# Patient Record
Sex: Male | Born: 1990
Health system: Southern US, Community
[De-identification: ages and names within clinical notes are randomized; demographics above are authoritative.]

## PROBLEM LIST (undated history)

## (undated) DIAGNOSIS — F32A Depression, unspecified: Secondary | ICD-10-CM

## (undated) HISTORY — DX: Depression, unspecified: F32.A

---

## 2020-08-11 DIAGNOSIS — Z20828 Contact with and (suspected) exposure to other viral communicable diseases: Secondary | ICD-10-CM | POA: Diagnosis not present

## 2020-10-18 DIAGNOSIS — G118 Other hereditary ataxias: Secondary | ICD-10-CM

## 2020-10-18 DIAGNOSIS — N200 Calculus of kidney: Secondary | ICD-10-CM

## 2020-10-18 HISTORY — DX: Other hereditary ataxias: G11.8

## 2020-10-18 HISTORY — DX: Calculus of kidney: N20.0

## 2020-10-20 ENCOUNTER — Ambulatory Visit: Payer: Self-pay | Admitting: Family Medicine

## 2020-10-21 ENCOUNTER — Other Ambulatory Visit: Payer: Self-pay

## 2020-10-22 ENCOUNTER — Ambulatory Visit (INDEPENDENT_AMBULATORY_CARE_PROVIDER_SITE_OTHER): Payer: BC Managed Care – PPO | Admitting: Family Medicine

## 2020-10-22 ENCOUNTER — Encounter: Payer: Self-pay | Admitting: Neurology

## 2020-10-22 ENCOUNTER — Encounter: Payer: Self-pay | Admitting: Family Medicine

## 2020-10-22 VITALS — BP 100/67 | HR 82 | Temp 97.9°F | Resp 16 | Ht 69.75 in | Wt 192.2 lb

## 2020-10-22 DIAGNOSIS — R42 Dizziness and giddiness: Secondary | ICD-10-CM | POA: Diagnosis not present

## 2020-10-22 DIAGNOSIS — R29818 Other symptoms and signs involving the nervous system: Secondary | ICD-10-CM | POA: Diagnosis not present

## 2020-10-22 DIAGNOSIS — Z0000 Encounter for general adult medical examination without abnormal findings: Secondary | ICD-10-CM

## 2020-10-22 DIAGNOSIS — Z23 Encounter for immunization: Secondary | ICD-10-CM

## 2020-10-22 LAB — CBC WITH DIFFERENTIAL/PLATELET
Basophils Absolute: 0 10*3/uL (ref 0.0–0.1)
Basophils Relative: 0.7 % (ref 0.0–3.0)
Eosinophils Absolute: 0.1 10*3/uL (ref 0.0–0.7)
Eosinophils Relative: 1.8 % (ref 0.0–5.0)
HCT: 40.8 % (ref 39.0–52.0)
Hemoglobin: 14 g/dL (ref 13.0–17.0)
Lymphocytes Relative: 29.4 % (ref 12.0–46.0)
Lymphs Abs: 1.4 10*3/uL (ref 0.7–4.0)
MCHC: 34.3 g/dL (ref 30.0–36.0)
MCV: 83.4 fl (ref 78.0–100.0)
Monocytes Absolute: 0.4 10*3/uL (ref 0.1–1.0)
Monocytes Relative: 8.7 % (ref 3.0–12.0)
Neutro Abs: 2.9 10*3/uL (ref 1.4–7.7)
Neutrophils Relative %: 59.4 % (ref 43.0–77.0)
Platelets: 205 10*3/uL (ref 150.0–400.0)
RBC: 4.89 Mil/uL (ref 4.22–5.81)
RDW: 13 % (ref 11.5–15.5)
WBC: 4.9 10*3/uL (ref 4.0–10.5)

## 2020-10-22 LAB — COMPREHENSIVE METABOLIC PANEL
ALT: 14 U/L (ref 0–53)
AST: 15 U/L (ref 0–37)
Albumin: 4.7 g/dL (ref 3.5–5.2)
Alkaline Phosphatase: 59 U/L (ref 39–117)
BUN: 15 mg/dL (ref 6–23)
CO2: 29 mEq/L (ref 19–32)
Calcium: 9.1 mg/dL (ref 8.4–10.5)
Chloride: 105 mEq/L (ref 96–112)
Creatinine, Ser: 0.91 mg/dL (ref 0.40–1.50)
GFR: 113.49 mL/min (ref >60.00)
Glucose, Bld: 90 mg/dL (ref 70–99)
Potassium: 4 mEq/L (ref 3.5–5.1)
Sodium: 139 mEq/L (ref 135–145)
Total Bilirubin: 0.9 mg/dL (ref 0.2–1.2)
Total Protein: 6.8 g/dL (ref 6.0–8.3)

## 2020-10-22 LAB — TSH: TSH: 0.84 u[IU]/mL (ref 0.35–4.50)

## 2020-10-22 NOTE — Progress Notes (Signed)
Office Note 10/22/2020  CC:  Chief Complaint  Patient presents with  . Establish Care    Previous PCP 3-4 years ago, now retired.    HPI:  Jimmy Stephenson is a 30 y.o. male who is here to establish care Patient's most recent primary MD: see above. Old records were not available for review prior to or during today's visit. Has speech impediment so this affects hx some.  Has "hard to describe" dizziness type complaint. Onset about 3 yrs ago, seems to be happening more frequently lately, 3-4 times per week, sometimes a few minutes but sometimes up to 1-2 hours.  Feels off balance, feels some arms and legs uncoordinated feeling, some difficulty getting words out normally, feeling "pressure in head" like "fuzzy thinking".  Sensation of muffled hearing sometimes.  Has experienced a couple episodes of blurry vision and a couple times double vision. Sometimes only one or two of the above things occurs, sometimes all. Happens at work for the most part but occ outside of work as well.  Works full time as Location manager, not very stressful or strenuous per his report.  No palpitations.  No HAs.  No paresthesias.  Says no assoc with poor fluid intake.   Gets 5-6 hrs sleep nightly, long term--no changes assoc with his sx's.   Half sister has MS (age 14). O/w no known famhx.  Past Medical History:  Diagnosis Date  . Depression    after a breakup with a GF    History reviewed. No pertinent surgical history.  History reviewed. No pertinent family history.  Social History   Socioeconomic History  . Marital status: Married    Spouse name: Not on file  . Number of children: Not on file  . Years of education: Not on file  . Highest education level: Not on file  Occupational History  . Not on file  Tobacco Use  . Smoking status: Former Smoker    Years: 3.00    Types: Cigarettes  . Smokeless tobacco: Never Used  Vaping Use  . Vaping Use: Never used  Substance and Sexual Activity  .  Alcohol use: Yes    Comment: social  . Drug use: Never  . Sexual activity: Not on file  Other Topics Concern  . Not on file  Social History Narrative   Married, no children.   Orig from [place] area.   Educ: [employer].  Some college ([employer]).   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   Alc: once in a while.   Tob: no   Drugs: none   Social Determinants of Corporate investment banker Strain: Not on file  Food Insecurity: Not on file  Transportation Needs: Not on file  Physical Activity: Not on file  Stress: Not on file  Social Connections: Not on file  Intimate Partner Violence: Not on file    No outpatient encounter medications on file as of 10/22/2020.   No facility-administered encounter medications on file as of 10/22/2020.    No Known Allergies  ROS Review of Systems  Constitutional: Negative for appetite change, chills, fatigue and fever.  HENT: Negative for congestion, dental problem, ear pain and sore throat.   Eyes: Positive for visual disturbance. Negative for discharge and redness.  Respiratory: Negative for cough, chest tightness, shortness of breath and wheezing.   Cardiovascular: Negative for chest pain, palpitations and leg swelling.  Gastrointestinal: Negative for abdominal pain, blood in stool, diarrhea, nausea and vomiting.  Endocrine: Negative for cold  intolerance, heat intolerance, polydipsia, polyphagia and polyuria.  Genitourinary: Negative for difficulty urinating, dysuria, flank pain, frequency, hematuria and urgency.  Musculoskeletal: Negative for arthralgias, back pain, joint swelling, myalgias and neck stiffness.  Skin: Negative for pallor and rash.  Neurological: Positive for dizziness and light-headedness. Negative for tremors, seizures, syncope, speech difficulty, weakness, numbness and headaches.  Hematological: Negative for adenopathy. Does not bruise/bleed easily.  Psychiatric/Behavioral: Negative for confusion and sleep  disturbance. The patient is not nervous/anxious.    PE; Blood pressure 100/67, pulse 82, temperature 97.9 F (36.6 C), temperature source Oral, resp. rate 16, height 5' 9.75" (1.772 m), weight 192 lb 3.2 oz (87.2 kg), SpO2 98 %. Body mass index is 27.78 kg/m.  Gen: Alert, well appearing.  Patient is oriented to person, place, time, and situation. AFFECT: pleasant, lucid thought and speech. ENT: Ears: EACs clear, normal epithelium.  TMs with good light reflex and landmarks bilaterally.  Eyes: no injection, icteris, swelling, or exudate.  EOMI, PERRLA. Nose: no drainage or turbinate edema/swelling.  No injection or focal lesion.  Mouth: lips without lesion/swelling.  Oral mucosa pink and moist.  Dentition intact and without obvious caries or gingival swelling.  Oropharynx without erythema, exudate, or swelling.  Neck: supple/nontender.  No LAD, mass, or TM.  Carotid pulses 2+ bilaterally, without bruits. CV: RRR, no m/r/g.   LUNGS: CTA bilat, nonlabored resps, good aeration in all lung fields. ABD: soft, NT, ND, BS normal.  No hepatospenomegaly or mass.  No bruits. EXT: no clubbing, cyanosis, or edema.  Musculoskeletal: no joint swelling, erythema, warmth, or tenderness.  ROM of all joints intact. Skin - no sores or suspicious lesions or rashes or color changes Neuro: CN 2-12 intact bilaterally, strength 5/5 in proximal and distal upper extremities and lower extremities bilaterally.  No sensory deficits.  No tremor.  No disdiadochokinesis.  No ataxia.  Upper extremity and lower extremity DTRs symmetric.  No pronator drift.  Pertinent labs:  none  ASSESSMENT AND PLAN:   New pt:  Transient neurologic symptoms. Unfortunately a bit nondescript/vague in nature. Unknown etiology but no alarming/red flag symptoms to suggest ominous neurologic disorder at this time. Will proceed with general labs (cbc, cmet, tsh) and have him see neurologist (? Atypical seizure d/o?). No meds or imaging at this  time.  Flu vaccine today. Covid UTD.  An After Visit Summary was printed and given to the patient.  Return in about 1 year (around 10/22/2021) for annual CPE (fasting).  Signed:  Santiago Bumpers, MD           10/22/2020

## 2020-12-17 NOTE — Progress Notes (Signed)
   NEUROLOGY CONSULTATION NOTE  Jimmy Stephenson MRN: [id number] DOB: August 25, 1991  Referring provider: Nicoletta Ba, MD Primary care provider: Nicoletta Ba, MD  Reason for consult:  Dizziness  Assessment/Plan:   1.  Transient recurrent episodes of dizziness/ataxia.    1.  MRI of brain with and without contrast 2.  Routine EEG 3.  If above testing unremarkable, I would check an ambulatory EEG.    Subjective:  Jimmy Stephenson is a 30 year old right-handed male who presents for dizziness.  History supplemented by referring provider's note.  He has had recurrent episodes of dizziness since he was young.  They used to occur once in awhile but have become more frequent since around 2019.  He describes onset of feeling lightheaded and  unable to control arms and legs, causing him to be unsteady on his feet.  He has trouble getting words out.  They may last minutes but can last up to 2 hours.  No headache, visual changes, diaphoresis, palpitations, numbness or weakness.  It usually occurs when he is at work.  It has never caused him to actually pass out or fall.  Denies history of headaches, seizures, or head trauma. Regarding family history:  Brother had seizures as a toddler.  He has an uncle with seizure disorder.  His half-sister has multiple sclerosis.  He was told by his mother that there is a family history of some kind of brainstem disorder.  CBC, CMP and TSH from January were normal  PAST MEDICAL HISTORY: Past Medical History:  Diagnosis Date  . Depression    after a breakup with a GF    PAST SURGICAL HISTORY: No past surgical history on file.  MEDICATIONS: No current outpatient medications on file prior to visit.   No current facility-administered medications on file prior to visit.    ALLERGIES: No Known Allergies  FAMILY HISTORY: No family history on file.   Objective:  Blood pressure 129/83, pulse 83, height 5\' 10"  (1.778 m), weight 199 lb 3.2 oz (90.4 kg), SpO2  98 %. General: No acute distress.  Patient appears well-groomed.   Head:  Normocephalic/atraumatic Eyes:  fundi examined but not visualized Neck: supple, no paraspinal tenderness, full range of motion Back: No paraspinal tenderness Heart: regular rate and rhythm Lungs: Clear to auscultation bilaterally. Vascular: No carotid bruits. Neurological Exam: Mental status: alert and oriented to person, place, and time, recent and remote memory intact, fund of knowledge intact, attention and concentration intact, speech fluent and not dysarthric, language intact. Cranial nerves: CN I: not tested CN II: pupils equal, round and reactive to light, visual fields intact CN III, IV, VI:  full range of motion, no nystagmus, no ptosis CN V: facial sensation intact. CN VII: upper and lower face symmetric CN VIII: hearing intact CN IX, X: gag intact, uvula midline CN XI: sternocleidomastoid and trapezius muscles intact CN XII: tongue midline Bulk & Tone: normal, no fasciculations. Motor:  muscle strength 5/5 throughout Sensation:  Pinprick, temperature and vibratory sensation intact. Deep Tendon Reflexes:  2+ throughout,  toes downgoing.   Finger to nose testing:  Without dysmetria.   Heel to shin:  Without dysmetria.   Gait:  Normal station and stride.  Romberg negative.    Thank you for allowing me to take part in the care of this patient.  , DO  CC:  Shon Millet, MD

## 2020-12-18 ENCOUNTER — Ambulatory Visit: Payer: BC Managed Care – PPO | Admitting: Neurology

## 2020-12-18 ENCOUNTER — Encounter: Payer: Self-pay | Admitting: Neurology

## 2020-12-18 ENCOUNTER — Other Ambulatory Visit: Payer: Self-pay

## 2020-12-18 VITALS — BP 129/83 | HR 83 | Ht 70.0 in | Wt 199.2 lb

## 2020-12-18 DIAGNOSIS — G118 Other hereditary ataxias: Secondary | ICD-10-CM

## 2020-12-18 DIAGNOSIS — R42 Dizziness and giddiness: Secondary | ICD-10-CM | POA: Diagnosis not present

## 2020-12-18 NOTE — Patient Instructions (Signed)
1.  We will check MRI of brain with and without contrast 2  We will check routine EEG 3.  Further recommendations pending results.

## 2020-12-22 ENCOUNTER — Other Ambulatory Visit: Payer: Self-pay

## 2020-12-22 ENCOUNTER — Ambulatory Visit (INDEPENDENT_AMBULATORY_CARE_PROVIDER_SITE_OTHER): Payer: BC Managed Care – PPO | Admitting: Neurology

## 2020-12-22 DIAGNOSIS — G118 Other hereditary ataxias: Secondary | ICD-10-CM

## 2020-12-22 DIAGNOSIS — R42 Dizziness and giddiness: Secondary | ICD-10-CM | POA: Diagnosis not present

## 2020-12-23 NOTE — Procedures (Signed)
ELECTROENCEPHALOGRAM REPORT  Date of Study: 12/22/2020  Patient's Name: Jimmy Stephenson MRN: [id number] Date of Birth: Apr 02, 1991   Clinical History: 30 year old male with episodic dizziness and trouble getting words out.  Medications: None  Technical Summary: A multichannel digital EEG recording measured by the international 10-20 system with electrodes applied with paste and impedances below 5000 ohms performed in our laboratory with EKG monitoring in an awake and asleep patient.  Hyperventilation not performed as patient wearing face mask due to COVID-19 pandemic.  Photic stimulation was performed.  The digital EEG was referentially recorded, reformatted, and digitally filtered in a variety of bipolar and referential montages for optimal display.    Description: The patient is awake and asleep during the recording.  During maximal wakefulness, there is a symmetric, medium voltage 9 Hz posterior dominant rhythm that attenuates with eye opening.  During drowsiness and sleep, there is an increase in theta slowing of the background with shifting asymmetric sharply contoured theta in the bilateral temporal region.  Vertex waves and symmetric sleep spindles were seen.  Photic stimulation did not elicit any abnormalities.  There were no epileptiform discharges or electrographic seizures seen.    EKG lead was unremarkable.  Impression: This awake and asleep EEG is within normal limits.    Clinical Correlation: A normal EEG does not exclude a clinical diagnosis of epilepsy.  If further clinical questions remain, prolonged EEG may be helpful.  Clinical correlation is advised.   Shon Millet, DO

## 2021-01-05 ENCOUNTER — Other Ambulatory Visit: Payer: BC Managed Care – PPO

## 2021-01-05 DIAGNOSIS — J189 Pneumonia, unspecified organism: Secondary | ICD-10-CM | POA: Diagnosis not present

## 2021-01-05 DIAGNOSIS — Z20828 Contact with and (suspected) exposure to other viral communicable diseases: Secondary | ICD-10-CM | POA: Diagnosis not present

## 2021-01-05 DIAGNOSIS — R509 Fever, unspecified: Secondary | ICD-10-CM | POA: Diagnosis not present

## 2021-01-05 DIAGNOSIS — R059 Cough, unspecified: Secondary | ICD-10-CM | POA: Diagnosis not present

## 2021-01-10 ENCOUNTER — Other Ambulatory Visit: Payer: BC Managed Care – PPO

## 2021-01-19 ENCOUNTER — Ambulatory Visit
Admission: RE | Admit: 2021-01-19 | Discharge: 2021-01-19 | Disposition: A | Discharge status: Home / Self Care | Payer: BC Managed Care – PPO | Source: Ambulatory Visit | Attending: Neurology | Admitting: Neurology

## 2021-01-19 DIAGNOSIS — R42 Dizziness and giddiness: Secondary | ICD-10-CM

## 2021-01-19 DIAGNOSIS — G118 Other hereditary ataxias: Secondary | ICD-10-CM

## 2021-01-19 MED ORDER — GADOBENATE DIMEGLUMINE 529 MG/ML IV SOLN
18.0000 mL | Freq: Once | INTRAVENOUS | Status: AC | PRN
  Administered 2021-01-19: 18 mL via INTRAVENOUS

## 2021-01-21 NOTE — Progress Notes (Signed)
   NEUROLOGY FOLLOW UP OFFICE NOTE  Draydon Clairmont [id number]  Assessment/Plan:   1.  Recurrent transient episodes of dizziness and ataxia.  Endorses family history.  MRI does not reveal cerebellar atrophy to suggest spinocerebellar ataxia.  Query episodic ataxia.  Definitive testing would be genetic testing which can be expensive.  Will first try to treat with trial of acetazolamide 500mg  twice daily.  Follow up 4 to 6 months.  Subjective:  Jimmy Stephenson is a 30 year old right-handed male who follows up for recurrent episodes of dizziness.  UPDATE: EEG on 12/22/2020 was normal.  MRI of brain on 01/19/2021 personally reviewed was within normal limits (noted nonspecific single punctate T2/FLAIR hyperintensity in right parietal white matter of likely no clinical significance).  HISTORY: He has had recurrent episodes of dizziness since he was young.  They used to occur once in awhile but have become more frequent since around 2019.  He describes onset of feeling lightheaded and  unable to control arms and legs, causing him to be unsteady on his feet.  He has trouble getting words out.  They may last minutes but can last up to 2 hours.  No headache, visual changes, diaphoresis, palpitations, numbness or weakness.  It usually occurs when he is at work.  It has never caused him to actually pass out or fall.  Denies history of headaches, seizures, or head trauma. Regarding family history:  Brother had seizures as a toddler.  He has an uncle with seizure disorder.  His half-sister has multiple sclerosis.  He was told by his mother that there is a family history of "some kind of brainstem disorder".  CBC, CMP and TSH from January 2022 were normal  PAST MEDICAL HISTORY: Past Medical History:  Diagnosis Date  . Depression    after a breakup with a GF    MEDICATIONS: No current outpatient medications on file prior to visit.   No current facility-administered medications on file prior to visit.     ALLERGIES: No Known Allergies  FAMILY HISTORY: Family History  Problem Relation Age of Onset  . Seizures Brother       Objective:  Blood pressure 119/79, pulse 81, height 5\' 10"  (1.778 m), weight 193 lb (87.5 kg), SpO2 97 %. General: No acute distress.  Patient appears well-groomed.     February 2022, DO

## 2021-01-23 ENCOUNTER — Ambulatory Visit: Payer: BC Managed Care – PPO | Admitting: Neurology

## 2021-01-23 ENCOUNTER — Encounter: Payer: Self-pay | Admitting: Neurology

## 2021-01-23 ENCOUNTER — Other Ambulatory Visit: Payer: Self-pay

## 2021-01-23 VITALS — BP 119/79 | HR 81 | Ht 70.0 in | Wt 193.0 lb

## 2021-01-23 DIAGNOSIS — G118 Other hereditary ataxias: Secondary | ICD-10-CM | POA: Diagnosis not present

## 2021-01-23 MED ORDER — ACETAZOLAMIDE ER 500 MG PO CP12: 500.0000 mg | ORAL_CAPSULE | Freq: Two times a day (BID) | ORAL | 5 refills | Status: DC

## 2021-01-23 NOTE — Patient Instructions (Signed)
I am not sure but I would like to treat you for a genetic condition called EPISODIC ATAXIA.  Genetic testing would be expensive but at least we can try treating it with the recommended medication.  The medication used to treat this is called acetazolamide.  Side effects may include numbness and tingling sensation, so don't worry if you experience this, it is just a side effect.  Keep track and see if you are experiencing less episodes of this dizziness.    Acetazolamide 500mg  twice daily Follow up 4 to 6 months.

## 2021-01-29 ENCOUNTER — Telehealth: Payer: Self-pay | Admitting: Neurology

## 2021-01-29 NOTE — Telephone Encounter (Signed)
Patient called requesting a call back from a nurse. He'd like to know if the medication he is taking, diamox 500 mg, could cause stomach acid problems?

## 2021-01-29 NOTE — Telephone Encounter (Signed)
Then numbness and tingling is not uncommon with the acetazolamide.  I am not sure about the reflux.  Can he just take something over the counter for reflux?  I otherwise have no other recommendation as far as medication

## 2021-01-29 NOTE — Telephone Encounter (Signed)
Pt advised. Pt states he having altered taste.

## 2021-01-29 NOTE — Telephone Encounter (Signed)
Telephone call to Jimmy Stephenson, Jimmy Stephenson wanted to know if the medication Diamax causes acid reflux. Per Jimmy Stephenson the acid reflux started a couple of days ago.   Jimmy Stephenson also c/o tingling sensation around his lips, bottoms of his feet and his finger tips.    Please advise.

## 2021-03-17 DIAGNOSIS — N209 Urinary calculus, unspecified: Secondary | ICD-10-CM | POA: Diagnosis not present

## 2021-03-17 DIAGNOSIS — N200 Calculus of kidney: Secondary | ICD-10-CM | POA: Diagnosis not present

## 2021-03-17 DIAGNOSIS — R1030 Lower abdominal pain, unspecified: Secondary | ICD-10-CM | POA: Diagnosis not present

## 2021-03-17 DIAGNOSIS — Z87442 Personal history of urinary calculi: Secondary | ICD-10-CM | POA: Diagnosis not present

## 2021-03-17 DIAGNOSIS — N1330 Unspecified hydronephrosis: Secondary | ICD-10-CM | POA: Diagnosis not present

## 2021-03-17 DIAGNOSIS — K753 Granulomatous hepatitis, not elsewhere classified: Secondary | ICD-10-CM | POA: Diagnosis not present

## 2021-06-15 ENCOUNTER — Ambulatory Visit: Payer: BC Managed Care – PPO | Admitting: Neurology

## 2021-07-15 NOTE — Progress Notes (Deleted)
   NEUROLOGY FOLLOW UP OFFICE NOTE  Jimmy Stephenson [id number]  Assessment/Plan:   ***  Subjective:  Jimmy Stephenson is a 30 year old right-handed male who follows up for recurrent episodes of dizziness.   UPDATE: Due to the possibility of episodic ataxia, he was started on acetazolamide 500mg  twice daily.  However, it caused paresthesias and altered taste.  ***   HISTORY: He has had recurrent episodes of dizziness since he was young.  They used to occur once in awhile but have become more frequent since around 2019.  He describes onset of feeling lightheaded and  unable to control arms and legs, causing him to be unsteady on his feet.  He has trouble getting words out.  They may last minutes but can last up to 2 hours.  No headache, visual changes, diaphoresis, palpitations, numbness or weakness.  It usually occurs when he is at work.  It has never caused him to actually pass out or fall.  EEG on 12/22/2020 was normal.  MRI of brain on 01/19/2021 personally reviewed was within normal limits (noted nonspecific single punctate T2/FLAIR hyperintensity in right parietal white matter of likely no clinical significance).   Denies history of headaches, seizures, or head trauma. Regarding family history:  Brother had seizures as a toddler.  He has an uncle with seizure disorder.  His half-sister has multiple sclerosis.  He was told by his mother that there is a family history of "some kind of brainstem disorder".  PAST MEDICAL HISTORY: Past Medical History:  Diagnosis Date   Depression    after a breakup with a GF    MEDICATIONS: Current Outpatient Medications on File Prior to Visit  Medication Sig Dispense Refill   acetaZOLAMIDE (DIAMOX) 500 MG capsule Take 1 capsule (500 mg total) by mouth 2 (two) times daily. 60 capsule 5   No current facility-administered medications on file prior to visit.    ALLERGIES: No Known Allergies  FAMILY HISTORY: Family History  Problem Relation Age of Onset    Seizures Brother       Objective:  *** General: No acute distress.  Patient appears ***-groomed.   Head:  Normocephalic/atraumatic Eyes:  Fundi examined but not visualized Neck: supple, no paraspinal tenderness, full range of motion Heart:  Regular rate and rhythm Lungs:  Clear to auscultation bilaterally Back: No paraspinal tenderness Neurological Exam: alert and oriented to person, place, and time.  Speech fluent and not dysarthric, language intact.  CN II-XII intact. Bulk and tone normal, muscle strength 5/5 throughout.  Sensation to light touch intact.  Deep tendon reflexes 2+ throughout, toes downgoing.  Finger to nose testing intact.  Gait normal, Romberg negative.   03/21/2021, DO  CC: ***

## 2021-07-16 ENCOUNTER — Ambulatory Visit: Payer: BC Managed Care – PPO | Admitting: Neurology

## 2021-10-12 NOTE — Progress Notes (Signed)
° °  NEUROLOGY FOLLOW UP OFFICE NOTE  Jimmy Stephenson [id number]  Assessment/Plan:   Recurrent transient episodes of dizziness and ataxia.  Endorses family history.  MRI does not reveal cerebellar atrophy to suggest spinocerebellar ataxia.  Query episodic ataxia.  Definitive testing would be genetic testing  As he is doing well, continue acetazolamide 500mg  once daily Follow up one year  Subjective:  Jimmy Stephenson is a 30 year old right-handed male who follows up for recurrent episodes of dizziness.   UPDATE: Initiated trial of acetazolamide to treat for possible episodic ataxia but only taking 500mg  once daily because he forgets to take it twice a day.  However, he dizzy spells have improved.     HISTORY: He has had recurrent episodes of dizziness since he was young.  They used to occur once in awhile but have become more frequent since around 2019.  He describes onset of feeling lightheaded and  unable to control arms and legs, causing him to be unsteady on his feet.  He has trouble getting words out.  They may last minutes but can last up to 2 hours.  No headache, visual changes, diaphoresis, palpitations, numbness or weakness.  It usually occurs when he is at work.  It has never caused him to actually pass out or fall.     CBC, CMP and TSH from January 2022 were normal.  EEG on 12/22/2020 was normal.  MRI of brain on 01/19/2021 was within normal limits (noted nonspecific single punctate T2/FLAIR hyperintensity in right parietal white matter of likely no clinical significance).  Denies history of headaches, seizures, or head trauma. Regarding family history:  Brother had seizures as a toddler.  He has an uncle with seizure disorder.  His half-sister has multiple sclerosis.  He was told by his mother that there is a family history of "some kind of brainstem disorder".  PAST MEDICAL HISTORY: Past Medical History:  Diagnosis Date   Depression    after a breakup with a GF    MEDICATIONS: Current  Outpatient Medications on File Prior to Visit  Medication Sig Dispense Refill   acetaZOLAMIDE (DIAMOX) 500 MG capsule Take 1 capsule (500 mg total) by mouth 2 (two) times daily. 60 capsule 5   No current facility-administered medications on file prior to visit.    ALLERGIES: No Known Allergies  FAMILY HISTORY: Family History  Problem Relation Age of Onset   Seizures Brother       Objective:  Blood pressure 110/73, pulse (!) 101, height 5\' 10"  (1.778 m), weight 186 lb 9.6 oz (84.6 kg), SpO2 96 %. General: No acute distress.  Patient appears well-groomed.   Head:  Normocephalic/atraumatic Eyes:  Fundi examined but not visualized Neck: supple, no paraspinal tenderness, full range of motion Heart:  Regular rate and rhythm Lungs:  Clear to auscultation bilaterally Back: No paraspinal tenderness Neurological Exam: alert and oriented to person, place, and time.  Speech fluent and not dysarthric, language intact.  CN II-XII intact. Bulk and tone normal, muscle strength 5/5 throughout.  Sensation to light touch intact.  Deep tendon reflexes 2+ throughout, toes downgoing.  Finger to nose testing intact.  Gait normal, Romberg negative.   02/21/2021, DO  CC: 03/21/2021 Family [facility] at [facility]

## 2021-10-13 ENCOUNTER — Other Ambulatory Visit: Payer: Self-pay

## 2021-10-13 ENCOUNTER — Ambulatory Visit: Payer: BC Managed Care – PPO | Admitting: Neurology

## 2021-10-13 ENCOUNTER — Encounter: Payer: Self-pay | Admitting: Neurology

## 2021-10-13 VITALS — BP 110/73 | HR 101 | Ht 70.0 in | Wt 186.6 lb

## 2021-10-13 DIAGNOSIS — G118 Other hereditary ataxias: Secondary | ICD-10-CM | POA: Diagnosis not present

## 2021-10-13 NOTE — Patient Instructions (Signed)
Continue acetazolamide 500mg  daily

## 2021-10-23 ENCOUNTER — Encounter: Payer: BC Managed Care – PPO | Admitting: Family Medicine

## 2021-11-05 ENCOUNTER — Encounter: Payer: BC Managed Care – PPO | Admitting: Family Medicine

## 2021-11-09 ENCOUNTER — Other Ambulatory Visit: Payer: Self-pay

## 2021-11-09 ENCOUNTER — Encounter: Payer: Self-pay | Admitting: Family Medicine

## 2021-11-09 ENCOUNTER — Ambulatory Visit (INDEPENDENT_AMBULATORY_CARE_PROVIDER_SITE_OTHER): Payer: BC Managed Care – PPO | Admitting: Family Medicine

## 2021-11-09 VITALS — BP 112/75 | HR 74 | Temp 97.9°F | Ht 71.0 in | Wt 187.2 lb

## 2021-11-09 DIAGNOSIS — Z23 Encounter for immunization: Secondary | ICD-10-CM | POA: Diagnosis not present

## 2021-11-09 DIAGNOSIS — Z0000 Encounter for general adult medical examination without abnormal findings: Secondary | ICD-10-CM | POA: Diagnosis not present

## 2021-11-09 NOTE — Progress Notes (Signed)
Office Note 11/09/2021  CC:  Chief Complaint  Patient presents with   Annual Exam    Pt is not fasting   Patient is a 31 y.o. male who is here for annual health maintenance exam. I last saw him 1 yr ago. A/P as of last visit: "Transient neurologic symptoms. Unfortunately a bit nondescript/vague in nature. Unknown etiology but no alarming/red flag symptoms to suggest ominous neurologic disorder at this time. Will proceed with general labs (cbc, cmet, tsh) and have him see neurologist (? Atypical seizure d/o?). No meds or imaging at this time."  INTERIM HX: All labs normal 1 yr ago. He is established with neurologist, Dr. Tomi Likens, for episodic ataxia.  MRI 01/19/21 normal. He was started on acetazolamide and as per last f/u with Dr. Tomi Likens 10/13/21 this has been helpful.  F/u with neuro 1 yr was recommended.  He reports doing well since getting on acetazolamide. No complaints today. Active at work but no exercise.   Past Medical History:  Diagnosis Date   Depression    after a breakup with a GF   Nephrolithiasis 2022   Left    History reviewed. No pertinent surgical history.  Family History  Problem Relation Age of Onset   Seizures Brother     Social History   Socioeconomic History   Marital status: Married    Spouse name: Not on file   Number of children: Not on file   Years of education: Not on file   Highest education level: Not on file  Occupational History   Not on file  Tobacco Use   Smoking status: Former    Years: 3.00    Types: Cigarettes   Smokeless tobacco: Never  Vaping Use   Vaping Use: Never used  Substance and Sexual Activity   Alcohol use: Yes    Comment: social   Drug use: Never   Sexual activity: Not on file  Other Topics Concern   Not on file  Social History Narrative   Married, no children.   Orig from [place] area.   Educ: [employer].  Some college ([employer]).   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   Alc:  once in a while.   Tob: no   Drugs: none   Social Determinants of Radio broadcast assistant Strain: Not on file  Food Insecurity: Not on file  Transportation Needs: Not on file  Physical Activity: Not on file  Stress: Not on file  Social Connections: Not on file  Intimate Partner Violence: Not on file    Outpatient Medications Prior to Visit  Medication Sig Dispense Refill   acetaZOLAMIDE (DIAMOX) 500 MG capsule Take 1 capsule (500 mg total) by mouth 2 (two) times daily. 60 capsule 5   No facility-administered medications prior to visit.    No Known Allergies  ROS Review of Systems  Constitutional:  Negative for appetite change, chills, fatigue and fever.  HENT:  Negative for congestion, dental problem, ear pain and sore throat.   Eyes:  Negative for discharge, redness and visual disturbance.  Respiratory:  Negative for cough, chest tightness, shortness of breath and wheezing.   Cardiovascular:  Negative for chest pain, palpitations and leg swelling.  Gastrointestinal:  Negative for abdominal pain, blood in stool, diarrhea, nausea and vomiting.  Genitourinary:  Negative for difficulty urinating, dysuria, flank pain, frequency, hematuria and urgency.  Musculoskeletal:  Negative for arthralgias, back pain, joint swelling, myalgias and neck stiffness.  Skin:  Negative for pallor and  rash.  Neurological:  Negative for dizziness, speech difficulty, weakness and headaches.  Hematological:  Negative for adenopathy. Does not bruise/bleed easily.  Psychiatric/Behavioral:  Negative for confusion and sleep disturbance. The patient is not nervous/anxious.    PE; Vitals with BMI 11/09/2021 10/13/2021 01/23/2021  Height 5\' 11"  5\' 10"  5\' 10"   Weight 187 lbs 3 oz 186 lbs 10 oz 193 lbs  BMI 26.12 [id number]  Systolic [id number]  Diastolic 75 73 79  Pulse 74 101 81   Gen: Alert, well appearing.  Patient is oriented to person, place, time, and situation. AFFECT: pleasant, lucid thought  and speech. ENT: Ears: EACs clear, normal epithelium.  TMs with good light reflex and landmarks bilaterally.  Eyes: no injection, icteris, swelling, or exudate.  EOMI, PERRLA. Nose: no drainage or turbinate edema/swelling.  No injection or focal lesion.  Mouth: lips without lesion/swelling.  Oral mucosa pink and moist.  Dentition intact and without obvious caries or gingival swelling.  Oropharynx without erythema, exudate, or swelling.  Neck: supple/nontender.  No LAD, mass, or TM.  Carotid pulses 2+ bilaterally, without bruits. CV: RRR, no m/r/g.   LUNGS: CTA bilat, nonlabored resps, good aeration in all lung fields. ABD: soft, NT, ND, BS normal.  No hepatospenomegaly or mass.  No bruits. EXT: no clubbing, cyanosis, or edema.  Musculoskeletal: no joint swelling, erythema, warmth, or tenderness.  ROM of all joints intact. Skin - no sores or suspicious lesions or rashes or color changes  Pertinent labs:  Lab Results  Component Value Date   TSH 0.84 10/22/2020   Lab Results  Component Value Date   WBC 4.9 10/22/2020   HGB 14.0 10/22/2020   HCT 40.8 10/22/2020   MCV 83.4 10/22/2020   PLT 205.0 10/22/2020   Lab Results  Component Value Date   CREATININE 0.91 10/22/2020   BUN 15 10/22/2020   NA 139 10/22/2020   K 4.0 10/22/2020   CL 105 10/22/2020   CO2 29 10/22/2020   Lab Results  Component Value Date   ALT 14 10/22/2020   AST 15 10/22/2020   ALKPHOS 59 10/22/2020   BILITOT 0.9 10/22/2020   ASSESSMENT AND PLAN:   Health maintenance exam: Reviewed age and gender appropriate health maintenance issues (prudent diet, regular exercise, health risks of tobacco and excessive alcohol, use of seatbelts, fire alarms in home, use of sunscreen).  Also reviewed age and gender appropriate health screening as well as vaccine recommendations. Vaccines: Tdap booster given, flu vaccine given. Labs: he declined any screening lab work today.  An After Visit Summary was printed and given to the  patient.  FOLLOW UP:  Return in about 1 year (around 11/09/2022) for annual CPE (fasting).  Signed:  Crissie Sickles, MD           11/09/2021

## 2021-11-09 NOTE — Patient Instructions (Signed)

## 2022-05-17 IMAGING — MR MR HEAD WO/W CM
13 series · 48 of 48 positions shown · IV contrast (17cc multihance)
Comparison: None.

CLINICAL DATA: Dizziness.

EXAM:
MRI HEAD WITHOUT AND WITH CONTRAST
TECHNIQUE: Multiplanar, multiecho pulse sequences of the brain and surrounding
structures were obtained without and with intravenous contrast.
CONTRAST:  18mL MULTIHANCE GADOBENATE DIMEGLUMINE 529 MG/ML IV SOLN

[Series 2: T1 · sagittal · 5.0mm · 0.45mm/px · 2 of 24 slices shown]
[im 1/24]
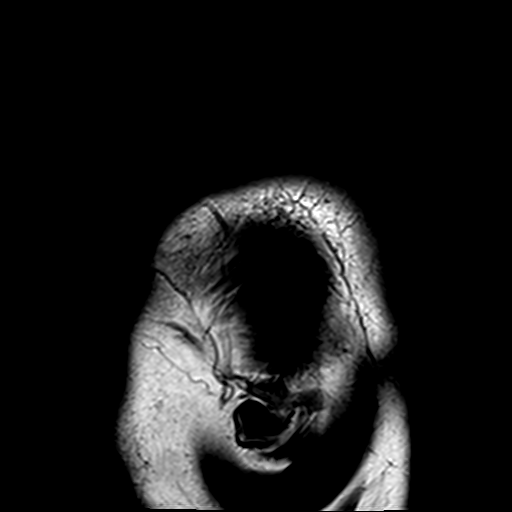
[im 24/24]
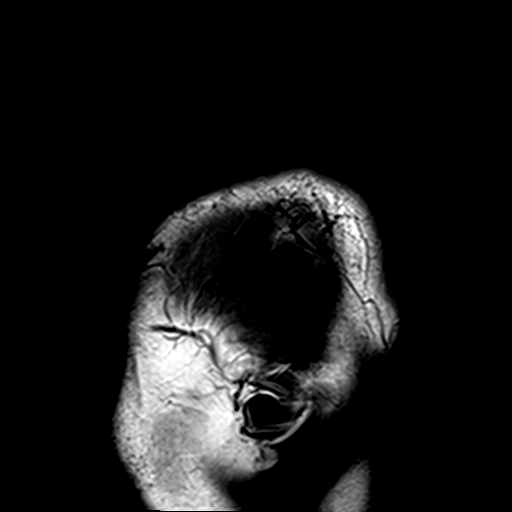

[Series 3: DWI · axial · 3.0mm · 1.80mm/px · z∈[-67,+77]mm · 6 of 100 slices shown (1 of 4)]
[im 1/100]
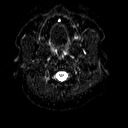
[im 20/100]
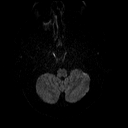
[im 40/100]
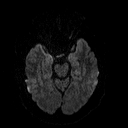
[im 60/100]
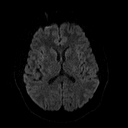
[im 80/100]
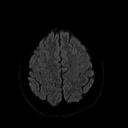
[im 100/100]
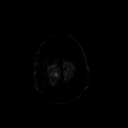

[Series 4: DWI · axial · 3.0mm · 1.80mm/px · z∈[-67,+77]mm · 3 of 49 slices shown (2 of 4)]
[im 1/49]
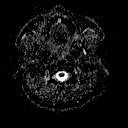
[im 25/49]
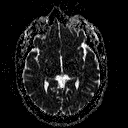
[im 49/49]
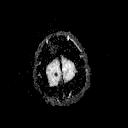

[Series 5: DWI · coronal · 5.0mm · 1.80mm/px · 5 of 72 slices shown (3 of 4)]
[im 1/72]
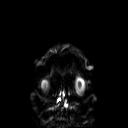
[im 18/72]
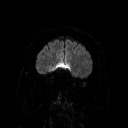
[im 36/72]
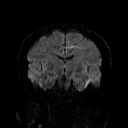
[im 54/72]
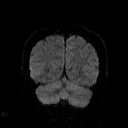
[im 72/72]
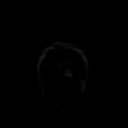

[Series 6: DWI · coronal · 5.0mm · 1.80mm/px · 2 of 36 slices shown (4 of 4)]
[im 1/36]
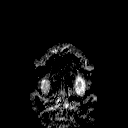
[im 36/36]
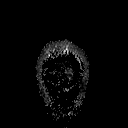

[Series 7: FLAIR · axial · 3.0mm · 0.45mm/px · z∈[-65,+76]mm · 2 of 32 slices shown (1 of 2)]
[im 1/32]
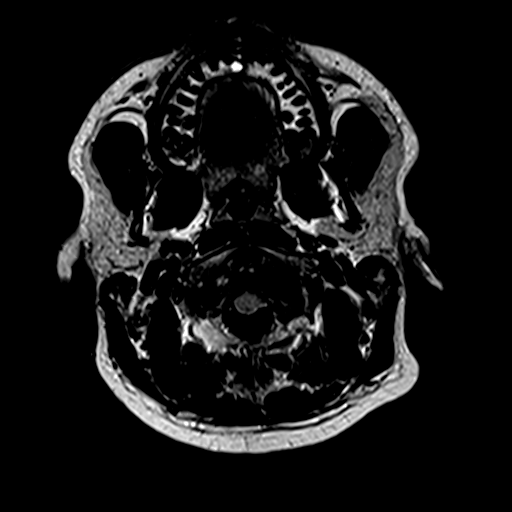
[im 32/32]
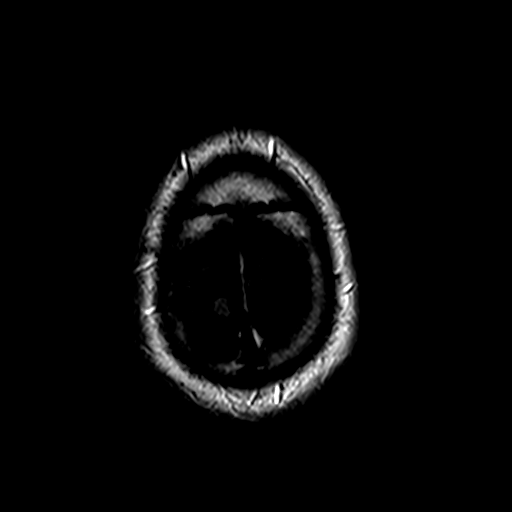

[Series 8: T2 · axial · 5.0mm · 0.60mm/px · z∈[-74,+78]mm · 2 of 24 slices shown (1 of 2)]
[im 1/24]
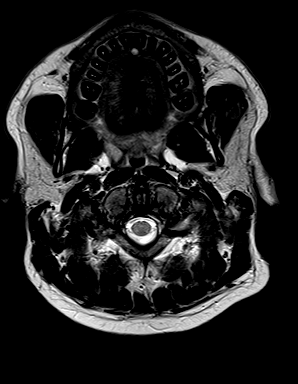
[im 24/24]
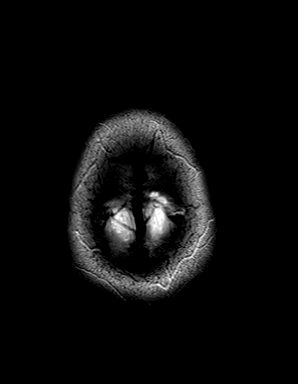

[Series 9: FLAIR · sagittal · 5.0mm · 0.45mm/px · 2 of 24 slices shown (2 of 2)]
[im 1/24]
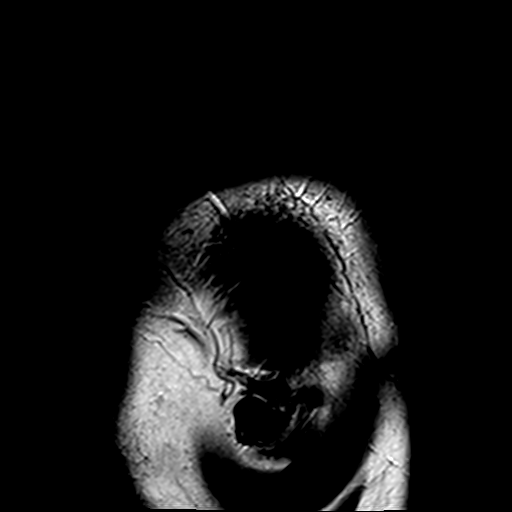
[im 24/24]
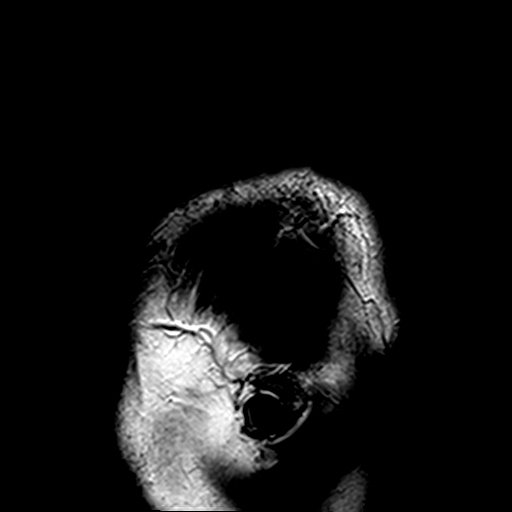

[Series 11: swi_images · axial · 4.0mm · 0.90mm/px · z∈[-64,+73]mm · 2 of 36 slices shown]
[im 1/36]
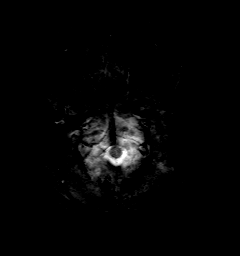
[im 36/36]
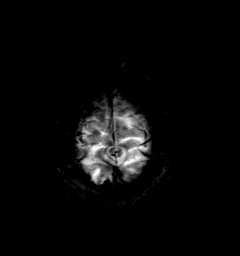

[Series 12: t1_mpr_tra · axial · 1.0mm · 0.75mm/px · z∈[-68,+72]mm · 9 of 144 slices shown]
[im 1/144]
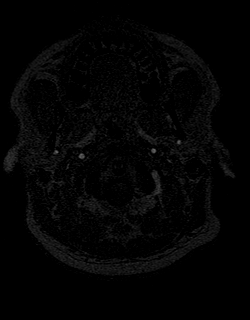
[im 18/144]
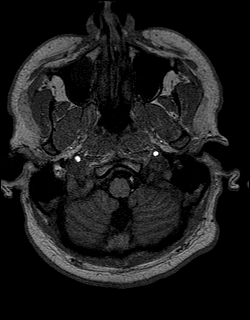
[im 36/144]
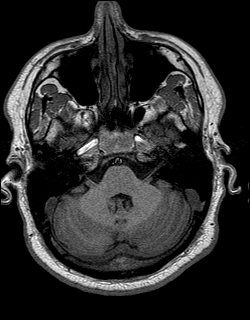
[im 54/144]
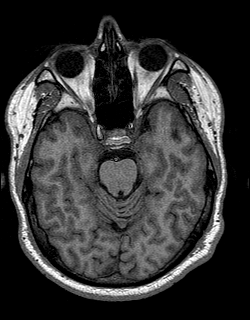
[im 72/144]
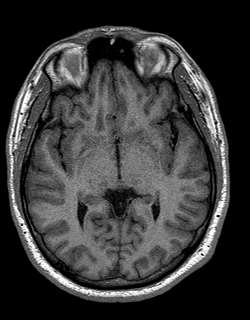
[im 90/144]
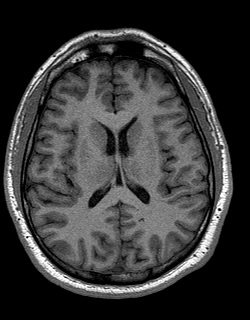
[im 108/144]
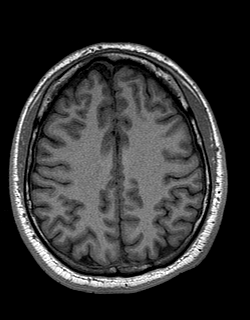
[im 126/144]
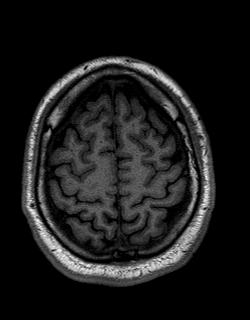
[im 144/144]
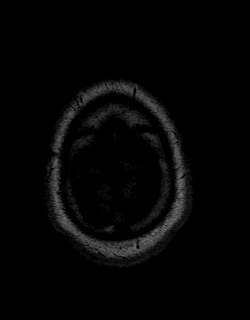

[Series 13: T2 · coronal · 5.0mm · 0.45mm/px · 2 of 28 slices shown (2 of 2)]
[im 1/28]
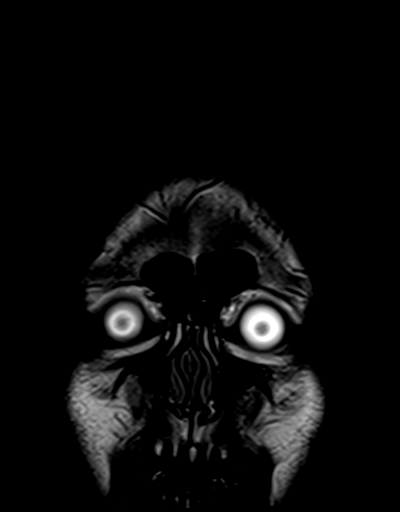
[im 28/28]
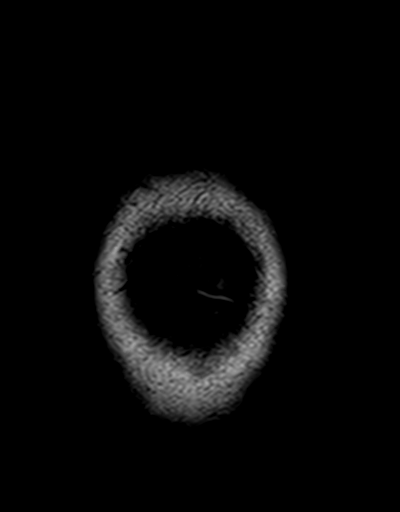

[Series 14: t1_mpr_tra post · axial · 1.0mm · 0.75mm/px · z∈[-68,+72]mm · 9 of 144 slices shown]
[im 1/144]
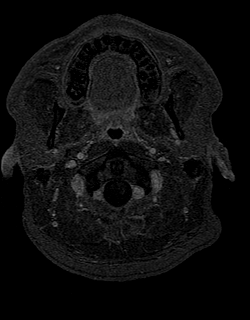
[im 18/144]
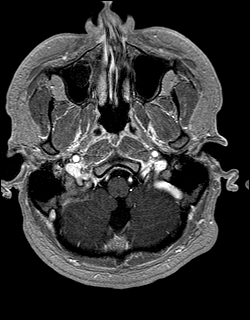
[im 36/144]
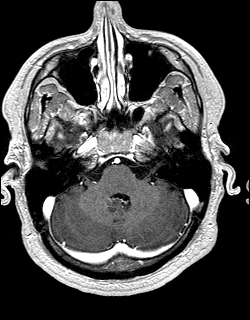
[im 54/144]
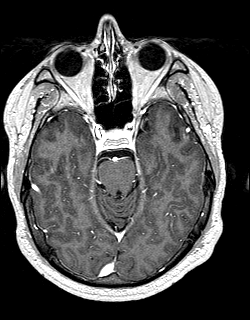
[im 72/144]
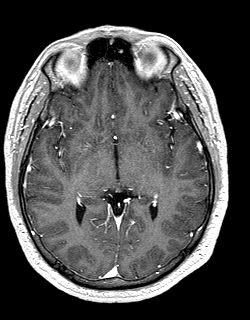
[im 90/144]
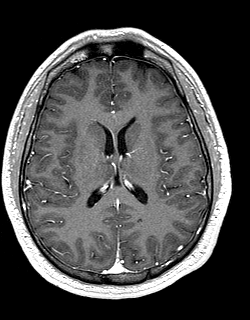
[im 108/144]
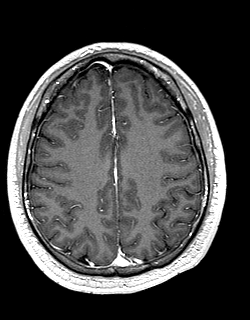
[im 126/144]
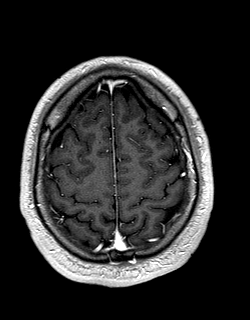
[im 144/144]
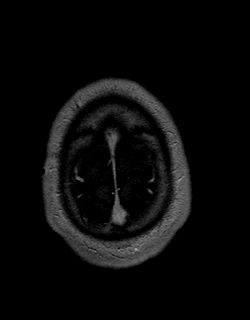

[Series 15: post cor · coronal · 5.0mm · 0.45mm/px · 2 of 28 slices shown]
[im 1/28]
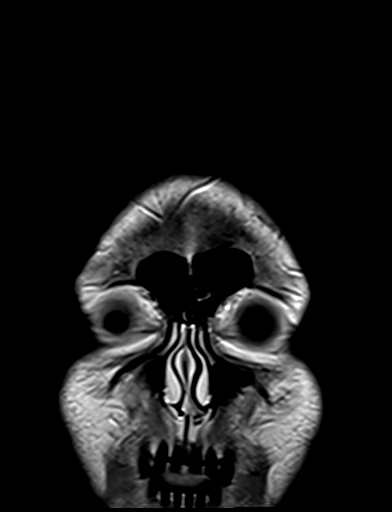
[im 28/28]
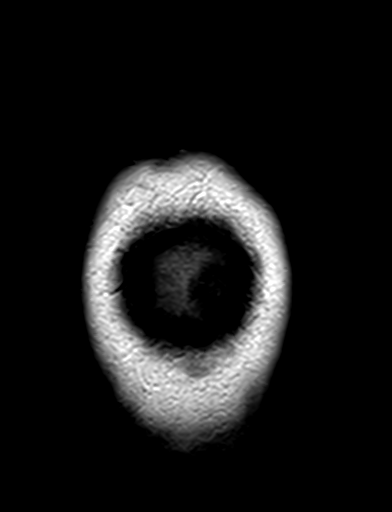

[48 of 48 positions shown; findings below may reference images not displayed]

FINDINGS: Brain: No acute infarction, hemorrhage, hydrocephalus, extra-axial
collection or mass lesion. Single T2/FLAIR hyperintensity within the
right parietal white matter, nonspecific but most likely related to
chronic microvascular ischemic disease and within normal limits for
patient age. Incidental subcentimeter nonenhancing pineal cyst

Vascular: Major arterial flow voids are maintained at the skull
base.

Skull and upper cervical spine: Normal marrow signal.

Sinuses/Orbits: Right maxillary sinus retention cyst and mild
ethmoid air cell mucosal thickening.

Other: No mastoid effusions.
IMPRESSION: No evidence of acute intracranial abnormality.

## 2022-05-28 ENCOUNTER — Other Ambulatory Visit: Payer: Self-pay | Admitting: Neurology

## 2022-06-30 DIAGNOSIS — H04123 Dry eye syndrome of bilateral lacrimal glands: Secondary | ICD-10-CM | POA: Diagnosis not present

## 2022-09-14 ENCOUNTER — Other Ambulatory Visit: Payer: Self-pay | Admitting: Neurology

## 2022-10-12 NOTE — Progress Notes (Deleted)
   NEUROLOGY FOLLOW UP OFFICE NOTE  Rhythm [facility]fall [id number]  Assessment/Plan:   Recurrent transient episodes of dizziness and ataxia.  Endorses family history.  MRI does not reveal cerebellar atrophy to suggest spinocerebellar ataxia.  Query episodic ataxia.  Definitive testing would be genetic testing  As he is doing well, continue acetazolamide 500mg  once daily Follow up one year  Subjective:  Jimmy Stephenson is a 31 year old right-handed male who follows up for recurrent episodes of dizziness.   UPDATE: Initiated trial of acetazolamide to treat for possible episodic ataxia but only taking 500mg  once daily because he forgets to take it twice a day.  However, he dizzy spells have improved.     HISTORY: He has had recurrent episodes of dizziness since he was young.  They used to occur once in awhile but have become more frequent since around 2019.  He describes onset of feeling lightheaded and  unable to control arms and legs, causing him to be unsteady on his feet.  He has trouble getting words out.  They may last minutes but can last up to 2 hours.  No headache, visual changes, diaphoresis, palpitations, numbness or weakness.  It usually occurs when he is at work.  It has never caused him to actually pass out or fall.     CBC, CMP and TSH from January 2022 were normal.  EEG on 12/22/2020 was normal.  MRI of brain on 01/19/2021 was within normal limits (noted nonspecific single punctate T2/FLAIR hyperintensity in right parietal white matter of likely no clinical significance).  Denies history of headaches, seizures, or head trauma. Regarding family history:  Brother had seizures as a toddler.  He has an uncle with seizure disorder.  His half-sister has multiple sclerosis.  He was told by his mother that there is a family history of "some kind of brainstem disorder".  PAST MEDICAL HISTORY: Past Medical History:  Diagnosis Date   Depression    after a breakup with a GF    MEDICATIONS: Current  Outpatient Medications on File Prior to Visit  Medication Sig Dispense Refill   acetaZOLAMIDE (DIAMOX) 500 MG capsule Take 1 capsule (500 mg total) by mouth 2 (two) times daily. 60 capsule 5   No current facility-administered medications on file prior to visit.    ALLERGIES: No Known Allergies  FAMILY HISTORY: Family History  Problem Relation Age of Onset   Seizures Brother       Objective:  *** General: No acute distress.  Patient appears well-groomed.   Head:  Normocephalic/atraumatic Eyes:  Fundi examined but not visualized Neck: supple, no paraspinal tenderness, full range of motion Heart:  Regular rate and rhythm Neurological Exam: alert and oriented to person, place, and time.  Speech fluent and not dysarthric, language intact.  CN II-XII intact. Bulk and tone normal, muscle strength 5/5 throughout.  Sensation to light touch intact.  Deep tendon reflexes 2+ throughout.  Finger to nose testing intact.  Gait normal, Romberg negative.   02/21/2021, DO  CC: 03/21/2021 Family Medicine at [facility]

## 2022-10-13 ENCOUNTER — Ambulatory Visit: Payer: Self-pay | Admitting: Neurology

## 2022-11-08 NOTE — Patient Instructions (Signed)
Health Maintenance, Male Adopting a healthy lifestyle and getting preventive care are important in promoting health and wellness. Ask your health care provider about: The right schedule for you to have regular tests and exams. Things you can do on your own to prevent diseases and keep yourself healthy. What should I know about diet, weight, and exercise? Eat a healthy diet  Eat a diet that includes plenty of vegetables, fruits, low-fat dairy products, and lean protein. Do not eat a lot of foods that are high in solid fats, added sugars, or sodium. Maintain a healthy weight Body mass index (BMI) is a measurement that can be used to identify possible weight problems. It estimates body fat based on height and weight. Your health care provider can help determine your BMI and help you achieve or maintain a healthy weight. Get regular exercise Get regular exercise. This is one of the most important things you can do for your health. Most adults should: Exercise for at least 150 minutes each week. The exercise should increase your heart rate and make you sweat (moderate-intensity exercise). Do strengthening exercises at least twice a week. This is in addition to the moderate-intensity exercise. Spend less time sitting. Even light physical activity can be beneficial. Watch cholesterol and blood lipids Have your blood tested for lipids and cholesterol at 33 years of age, then have this test every 5 years. You may need to have your cholesterol levels checked more often if: Your lipid or cholesterol levels are high. You are older than 33 years of age. You are at high risk for heart disease. What should I know about cancer screening? Many types of cancers can be detected early and may often be prevented. Depending on your health history and family history, you may need to have cancer screening at various ages. This may include screening for: Colorectal cancer. Prostate cancer. Skin cancer. Lung  cancer. What should I know about heart disease, diabetes, and high blood pressure? Blood pressure and heart disease High blood pressure causes heart disease and increases the risk of stroke. This is more likely to develop in people who have high blood pressure readings or are overweight. Talk with your health care provider about your target blood pressure readings. Have your blood pressure checked: Every 3-5 years if you are 18-39 years of age. Every year if you are 40 years old or older. If you are between the ages of 65 and 75 and are a current or former smoker, ask your health care provider if you should have a one-time screening for abdominal aortic aneurysm (AAA). Diabetes Have regular diabetes screenings. This checks your fasting blood sugar level. Have the screening done: Once every three years after age 45 if you are at a normal weight and have a low risk for diabetes. More often and at a younger age if you are overweight or have a high risk for diabetes. What should I know about preventing infection? Hepatitis B If you have a higher risk for hepatitis B, you should be screened for this virus. Talk with your health care provider to find out if you are at risk for hepatitis B infection. Hepatitis C Blood testing is recommended for: Everyone born from 1945 through 1965. Anyone with known risk factors for hepatitis C. Sexually transmitted infections (STIs) You should be screened each year for STIs, including gonorrhea and chlamydia, if: You are sexually active and are younger than 33 years of age. You are older than 33 years of age and your   health care provider tells you that you are at risk for this type of infection. Your sexual activity has changed since you were last screened, and you are at increased risk for chlamydia or gonorrhea. Ask your health care provider if you are at risk. Ask your health care provider about whether you are at high risk for HIV. Your health care provider  may recommend a prescription medicine to help prevent HIV infection. If you choose to take medicine to prevent HIV, you should first get tested for HIV. You should then be tested every 3 months for as long as you are taking the medicine. Follow these instructions at home: Alcohol use Do not drink alcohol if your health care provider tells you not to drink. If you drink alcohol: Limit how much you have to 0-2 drinks a day. Know how much alcohol is in your drink. In the [place] one drink equals one 12 oz bottle of beer (355 mL), one 5 oz glass of wine (148 mL), or one 1 oz glass of hard liquor (44 mL). Lifestyle Do not use any products that contain nicotine or tobacco. These products include cigarettes, chewing tobacco, and vaping devices, such as e-cigarettes. If you need help quitting, ask your health care provider. Do not use street drugs. Do not share needles. Ask your health care provider for help if you need support or information about quitting drugs. General instructions Schedule regular health, dental, and eye exams. Stay current with your vaccines. Tell your health care provider if: You often feel depressed. You have ever been abused or do not feel safe at home. Summary Adopting a healthy lifestyle and getting preventive care are important in promoting health and wellness. Follow your health care provider's instructions about healthy diet, exercising, and getting tested or screened for diseases. Follow your health care provider's instructions on monitoring your cholesterol and blood pressure. This information is not intended to replace advice given to you by your health care provider. Make sure you discuss any questions you have with your health care provider. Document Revised: 02/23/2021 Document Reviewed: 02/23/2021 Elsevier Patient Education  2023 Elsevier Inc.  

## 2022-11-10 ENCOUNTER — Encounter: Payer: Self-pay | Admitting: Family Medicine

## 2022-11-10 ENCOUNTER — Ambulatory Visit (INDEPENDENT_AMBULATORY_CARE_PROVIDER_SITE_OTHER): Payer: BC Managed Care – PPO | Admitting: Family Medicine

## 2022-11-10 VITALS — BP 110/64 | HR 84 | Temp 98.4°F | Ht 71.0 in | Wt 198.8 lb

## 2022-11-10 DIAGNOSIS — Z23 Encounter for immunization: Secondary | ICD-10-CM

## 2022-11-10 DIAGNOSIS — Z0000 Encounter for general adult medical examination without abnormal findings: Secondary | ICD-10-CM | POA: Diagnosis not present

## 2022-11-10 NOTE — Progress Notes (Signed)
Office Note 11/10/2022  CC: cpe  HPI:  Patient is a 32 y.o. male who is here for annual health maintenance exam.  No significant ataxia problems, continues to take acetazolamide. He no longer follows up with Dr. Tomi Likens, says he did not see the point.  He asks about getting a erectile dysfunction medication today. Says the problem is mainly that he cannot finish intercourse to climax. This is not a problem that is always present. His libido is normal. He has tried Viagra and it did not help.  Past Medical History:  Diagnosis Date   Depression    after a breakup with a GF   Episodic ataxia ([place]) 2022   Neuro, Dr. Jaffe-->acetazolamide   Nephrolithiasis 2022   Left    History reviewed. No pertinent surgical history.  Family History  Problem Relation Age of Onset   Seizures Brother     Social History   Socioeconomic History   Marital status: Married    Spouse name: Not on file   Number of children: Not on file   Years of education: Not on file   Highest education level: Not on file  Occupational History   Not on file  Tobacco Use   Smoking status: Former    Years: 3.00    Types: Cigarettes   Smokeless tobacco: Never  Vaping Use   Vaping Use: Never used  Substance and Sexual Activity   Alcohol use: Yes    Comment: social   Drug use: Never   Sexual activity: Not on file  Other Topics Concern   Not on file  Social History Narrative   Married, no children.   Orig from [place] area.   Educ: [employer].  Some college ([employer]).   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   Alc: once in a while.   Tob: no   Drugs: none   Social Determinants of Radio broadcast assistant Strain: Not on file  Food Insecurity: Not on file  Transportation Needs: Not on file  Physical Activity: Not on file  Stress: Not on file  Social Connections: Not on file  Intimate Partner Violence: Not on file    Outpatient Medications Prior to Visit  Medication  Sig Dispense Refill   acetaZOLAMIDE ER (DIAMOX) 500 MG capsule Take 1 capsule by mouth twice daily 60 capsule 0   No facility-administered medications prior to visit.    No Known Allergies  Review of Systems  Constitutional:  Negative for appetite change, chills, fatigue and fever.  HENT:  Negative for congestion, dental problem, ear pain and sore throat.   Eyes:  Negative for discharge, redness and visual disturbance.  Respiratory:  Negative for cough, chest tightness, shortness of breath and wheezing.   Cardiovascular:  Negative for chest pain, palpitations and leg swelling.  Gastrointestinal:  Negative for abdominal pain, blood in stool, diarrhea, nausea and vomiting.  Genitourinary:  Negative for difficulty urinating, dysuria, flank pain, frequency, hematuria and urgency.  Musculoskeletal:  Negative for arthralgias, back pain, joint swelling, myalgias and neck stiffness.  Skin:  Negative for pallor and rash.  Neurological:  Negative for dizziness, speech difficulty, weakness and headaches.  Hematological:  Negative for adenopathy. Does not bruise/bleed easily.  Psychiatric/Behavioral:  Negative for confusion and sleep disturbance. The patient is not nervous/anxious.     PE;    11/10/2022    8:52 AM 11/09/2021    1:08 PM 10/13/2021   10:33 AM  Vitals with BMI  Height 5\' 11"  5'  11" 5\' 10"   Weight 198 lbs 13 oz 187 lbs 3 oz 186 lbs 10 oz  BMI 27.74 67.61 95.09  Systolic 326 712 458  Diastolic 64 75 73  Pulse 84 74 101    Gen: Alert, well appearing.  Patient is oriented to person, place, time, and situation. AFFECT: pleasant, lucid thought and speech. ENT: Ears: EACs clear, normal epithelium.  TMs with good light reflex and landmarks bilaterally.  Eyes: no injection, icteris, swelling, or exudate.  EOMI, PERRLA. Nose: no drainage or turbinate edema/swelling.  No injection or focal lesion.  Mouth: lips without lesion/swelling.  Oral mucosa pink and moist.  Dentition intact and  without obvious caries or gingival swelling.  Oropharynx without erythema, exudate, or swelling.  Neck: supple/nontender.  No LAD, mass, or TM.  Carotid pulses 2+ bilaterally, without bruits. CV: RRR, no m/r/g.   LUNGS: CTA bilat, nonlabored resps, good aeration in all lung fields. ABD: soft, NT, ND, BS normal.  No hepatospenomegaly or mass.  No bruits. EXT: no clubbing, cyanosis, or edema.  Musculoskeletal: no joint swelling, erythema, warmth, or tenderness.  ROM of all joints intact. Skin - no sores or suspicious lesions or rashes or color changes  Pertinent labs:  Lab Results  Component Value Date   TSH 0.84 10/22/2020   Lab Results  Component Value Date   WBC 4.9 10/22/2020   HGB 14.0 10/22/2020   HCT 40.8 10/22/2020   MCV 83.4 10/22/2020   PLT 205.0 10/22/2020   Lab Results  Component Value Date   CREATININE 0.91 10/22/2020   BUN 15 10/22/2020   NA 139 10/22/2020   K 4.0 10/22/2020   CL 105 10/22/2020   CO2 29 10/22/2020   Lab Results  Component Value Date   ALT 14 10/22/2020   AST 15 10/22/2020   ALKPHOS 59 10/22/2020   BILITOT 0.9 10/22/2020   ASSESSMENT AND PLAN:   No problem-specific Assessment & Plan notes found for this encounter.  Health maintenance exam: Reviewed age and gender appropriate health maintenance issues (prudent diet, regular exercise, health risks of tobacco and excessive alcohol, use of seatbelts, fire alarms in home, use of sunscreen).  Also reviewed age and gender appropriate health screening as well as vaccine recommendations. Vaccines: flu vaccine given.  O/w UTD. Labs: he declined any screening lab work today.  Erectile dysfunction.  Psychogenic. Recommended no ED medication at this time. Reassured patient.  An After Visit Summary was printed and given to the patient.  FOLLOW UP:  Return in about 1 year (around 11/11/2023) for annual CPE (fasting.  Signed:  Crissie Sickles, MD           11/10/2022

## 2023-02-14 NOTE — Progress Notes (Unsigned)
   NEUROLOGY FOLLOW UP OFFICE NOTE  Jimmy Stephenson [id number]  Assessment/Plan:   Recurrent transient episodes of dizziness and ataxia.  Endorses family history.  MRI does not reveal cerebellar atrophy to suggest spinocerebellar ataxia.  Query episodic ataxia.  Definitive testing would be genetic testing  Acetazolamide 500mg  once daily *** Follow up one year ***  Subjective:  Jimmy Stephenson is a 32 year old right-handed male who follows up for recurrent episodes of dizziness.   UPDATE: Taking acetazolamide 500mg  daily. ***   HISTORY: He has had recurrent episodes of dizziness since he was young.  They used to occur once in awhile but have become more frequent since around 2019.  He describes onset of feeling lightheaded and  unable to control arms and legs, causing him to be unsteady on his feet.  He has trouble getting words out.  They may last minutes but can last up to 2 hours.  No headache, visual changes, diaphoresis, palpitations, numbness or weakness.  It usually occurs when he is at work.  It has never caused him to actually pass out or fall.     CBC, CMP and TSH from January 2022 were normal.  EEG on 12/22/2020 was normal.  MRI of brain on 01/19/2021 was within normal limits (noted nonspecific single punctate T2/FLAIR hyperintensity in right parietal white matter of likely no clinical significance).  Denies history of headaches, seizures, or head trauma. Regarding family history:  Brother had seizures as a toddler.  He has an uncle with seizure disorder.  His half-sister has multiple sclerosis.  He was told by his mother that there is a family history of "some kind of brainstem disorder".  PAST MEDICAL HISTORY: Past Medical History:  Diagnosis Date   Depression    after a breakup with a GF   Episodic ataxia (HCC) 2022   Neuro, Dr. Sybilla Malhotra-->acetazolamide   Nephrolithiasis 2022   Left    MEDICATIONS: Current Outpatient Medications on File Prior to Visit  Medication Sig Dispense  Refill   acetaZOLAMIDE ER (DIAMOX) 500 MG capsule Take 1 capsule by mouth twice daily 60 capsule 0   No current facility-administered medications on file prior to visit.    ALLERGIES: No Known Allergies  FAMILY HISTORY: Family History  Problem Relation Age of Onset   Seizures Brother       Objective:  *** General: No acute distress.  Patient appears well-groomed.   Head:  Normocephalic/atraumatic Eyes:  Fundi examined but not visualized Neck: supple, no paraspinal tenderness, full range of motion Heart:  Regular rate and rhythm Back: No paraspinal tenderness Neurological Exam: alert and oriented to person, place, and time.  Speech fluent and not dysarthric, language intact.  CN II-XII intact. Bulk and tone normal, muscle strength 5/5 throughout.  Sensation to temperature and vibration intact.  Deep tendon reflexes 2+ throughout, toes downgoing.  Finger to nose testing intact.  Gait normal, Romberg negative.   Shon Millet, DO  CC: [facility]

## 2023-02-15 ENCOUNTER — Encounter: Payer: Self-pay | Admitting: Neurology

## 2023-02-15 ENCOUNTER — Ambulatory Visit: Payer: BC Managed Care – PPO | Admitting: Neurology

## 2023-02-15 VITALS — BP 118/75 | HR 88 | Ht 70.0 in | Wt 204.0 lb

## 2023-02-15 DIAGNOSIS — G118 Other hereditary ataxias: Secondary | ICD-10-CM | POA: Diagnosis not present

## 2023-02-15 MED ORDER — ACETAZOLAMIDE ER 500 MG PO CP12: 500.0000 mg | ORAL_CAPSULE | Freq: Every day | ORAL | 3 refills | Status: DC

## 2023-02-15 NOTE — Patient Instructions (Addendum)
Continue acetazolamide 500mg  daily Will check genetic testing - find out cost from your insurance. [facility] will give a call for consent and explain the process of the labs as well as go over cost at the time. After consent the [facility] will send the Kit and set up a date for the mobile unit to come out and draw blood. Follow up one year

## 2023-05-06 DIAGNOSIS — M791 Myalgia, unspecified site: Secondary | ICD-10-CM | POA: Diagnosis not present

## 2023-05-06 DIAGNOSIS — R0981 Nasal congestion: Secondary | ICD-10-CM | POA: Diagnosis not present

## 2023-05-06 DIAGNOSIS — R509 Fever, unspecified: Secondary | ICD-10-CM | POA: Diagnosis not present

## 2023-05-06 DIAGNOSIS — R051 Acute cough: Secondary | ICD-10-CM | POA: Diagnosis not present

## 2023-11-10 ENCOUNTER — Encounter: Payer: Self-pay | Admitting: Neurology

## 2023-11-16 ENCOUNTER — Encounter: Payer: Self-pay | Admitting: Family Medicine

## 2023-11-16 ENCOUNTER — Ambulatory Visit (INDEPENDENT_AMBULATORY_CARE_PROVIDER_SITE_OTHER): Payer: BC Managed Care – PPO | Admitting: Family Medicine

## 2023-11-16 VITALS — BP 115/78 | HR 84 | Ht 71.5 in | Wt 211.2 lb

## 2023-11-16 DIAGNOSIS — Z0000 Encounter for general adult medical examination without abnormal findings: Secondary | ICD-10-CM

## 2023-11-16 NOTE — Progress Notes (Signed)
Office Note 11/16/2023  CC:  Chief Complaint  Patient presents with   Annual Exam    Pt is fasting.     HPI:  Patient is a 33 y.o. male who is here for annual health maintenance exam. Lennie feel well. He has no concerns.   Past Medical History:  Diagnosis Date   Depression    after a breakup with a GF   Episodic ataxia (HCC) 2022   Neuro, Dr. Jaffe-->acetazolamide   Nephrolithiasis 2022   Left    History reviewed. No pertinent surgical history.  Family History  Problem Relation Age of Onset   Seizures Brother     Social History   Socioeconomic History   Marital status: Married    Spouse name: Not on file   Number of children: Not on file   Years of education: Not on file   Highest education level: Not on file  Occupational History   Not on file  Tobacco Use   Smoking status: Former    Types: Cigarettes   Smokeless tobacco: Never  Vaping Use   Vaping status: Never Used  Substance and Sexual Activity   Alcohol use: Yes    Comment: social   Drug use: Never   Sexual activity: Not on file  Other Topics Concern   Not on file  Social History Narrative   Married, no children.   Orig from [place] area.   Educ: [employer].  Some college ([employer]).   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   Alc: once in a while.   Tob: no   Drugs: none   Social Drivers of Corporate investment banker Strain: Not on file  Food Insecurity: Not on file  Transportation Needs: Not on file  Physical Activity: Not on file  Stress: Not on file (08/25/2023)  Social Connections: Not on file  Intimate Partner Violence: Not on file    Outpatient Medications Prior to Visit  Medication Sig Dispense Refill   acetaZOLAMIDE ER (DIAMOX) 500 MG capsule Take 1 capsule (500 mg total) by mouth daily. 90 capsule 3   No facility-administered medications prior to visit.    No Known Allergies  Review of Systems  Constitutional:  Negative for appetite change,  chills, fatigue and fever.  HENT:  Negative for congestion, dental problem, ear pain and sore throat.   Eyes:  Negative for discharge, redness and visual disturbance.  Respiratory:  Negative for cough, chest tightness, shortness of breath and wheezing.   Cardiovascular:  Negative for chest pain, palpitations and leg swelling.  Gastrointestinal:  Negative for abdominal pain, blood in stool, diarrhea, nausea and vomiting.  Genitourinary:  Negative for difficulty urinating, dysuria, flank pain, frequency, hematuria and urgency.  Musculoskeletal:  Negative for arthralgias, back pain, joint swelling, myalgias and neck stiffness.  Skin:  Negative for pallor and rash.  Neurological:  Negative for dizziness, speech difficulty, weakness and headaches.  Hematological:  Negative for adenopathy. Does not bruise/bleed easily.  Psychiatric/Behavioral:  Negative for confusion and sleep disturbance. The patient is not nervous/anxious.     PE;    11/16/2023    8:47 AM 02/15/2023   10:57 AM 11/10/2022    8:52 AM  Vitals with BMI  Height 5' 11.5" 5\' 10"  5\' 11"   Weight 211 lbs 3 oz 204 lbs 198 lbs 13 oz  BMI 29.05 29.27 27.74  Systolic 115 118 147  Diastolic 78 75 64  Pulse 84 88 84   Gen: Alert, well appearing.  Patient  is oriented to person, place, time, and situation. AFFECT: pleasant, lucid thought and speech. ENT: Ears: EACs clear, normal epithelium.  TMs with good light reflex and landmarks bilaterally.  Eyes: no injection, icteris, swelling, or exudate.  EOMI, PERRLA. Nose: no drainage or turbinate edema/swelling.  No injection or focal lesion.  Mouth: lips without lesion/swelling.  Oral mucosa pink and moist.  Dentition intact and without obvious caries or gingival swelling.  Oropharynx without erythema, exudate, or swelling.  Neck: supple/nontender.  No LAD, mass, or TM.  Carotid pulses 2+ bilaterally, without bruits. CV: RRR, no m/r/g.   LUNGS: CTA bilat, nonlabored resps, good aeration in all  lung fields. ABD: soft, NT, ND, BS normal.  No hepatospenomegaly or mass.  No bruits. EXT: no clubbing, cyanosis, or edema.  Musculoskeletal: no joint swelling, erythema, warmth, or tenderness.  ROM of all joints intact. Skin - no sores or suspicious lesions or rashes or color changes  Pertinent labs:  Lab Results  Component Value Date   TSH 0.84 10/22/2020   Lab Results  Component Value Date   WBC 4.9 10/22/2020   HGB 14.0 10/22/2020   HCT 40.8 10/22/2020   MCV 83.4 10/22/2020   PLT 205.0 10/22/2020   Lab Results  Component Value Date   CREATININE 0.91 10/22/2020   BUN 15 10/22/2020   NA 139 10/22/2020   K 4.0 10/22/2020   CL 105 10/22/2020   CO2 29 10/22/2020   Lab Results  Component Value Date   ALT 14 10/22/2020   AST 15 10/22/2020   ALKPHOS 59 10/22/2020   BILITOT 0.9 10/22/2020   ASSESSMENT AND PLAN:   Health maintenance exam: Reviewed age and gender appropriate health maintenance issues (prudent diet, regular exercise, health risks of tobacco and excessive alcohol, use of seatbelts, fire alarms in home, use of sunscreen).  Also reviewed age and gender appropriate health screening as well as vaccine recommendations. Vaccines: Flu declined.  O/W All UTD. Labs: he declined any screening lab work today.  An After Visit Summary was printed and given to the patient.  FOLLOW UP:  Return in about 1 year (around 11/15/2024) for annual CPE (fasting).  Signed:  Santiago Bumpers, MD           11/16/2023

## 2024-02-15 ENCOUNTER — Ambulatory Visit: Payer: BC Managed Care – PPO | Admitting: Neurology

## 2024-03-14 ENCOUNTER — Ambulatory Visit: Payer: BC Managed Care – PPO | Admitting: Neurology

## 2024-09-16 NOTE — Progress Notes (Unsigned)
   NEUROLOGY FOLLOW UP OFFICE NOTE  Jimmy Stephenson [id number]  Assessment/Plan:   Recurrent transient episodes of dizziness and ataxia.  Endorses family history.  MRI does not reveal cerebellar atrophy to suggest spinocerebellar ataxia.  Query episodic ataxia.    Acetazolamide  500mg  once daily.  Check BMP. Since he is doing well, defers genetic testing which is reasonable Follow up one year   Subjective:  Jimmy Stephenson is a 33 year old right-handed male who follows up for recurrent episodes of dizziness.   UPDATE: Taking acetazolamide  500mg  daily. He is well.  He does not have an episode of ataxia.  Last time he was interested in genetic testing that included KCNA1 and CACNA1A but never had a chance to have it done.     HISTORY: He has had recurrent episodes of dizziness since he was young.  They used to occur once in awhile but have become more frequent since around 2019.  He describes onset of feeling lightheaded and  unable to control arms and legs, causing him to be unsteady on his feet.  He has trouble getting words out.  They may last minutes but can last up to 2 hours.  No headache, visual changes, diaphoresis, palpitations, numbness or weakness.  It usually occurs when he is at work.  It has never caused him to actually pass out or fall.     CBC, CMP and TSH from January 2022 were normal.  EEG on 12/22/2020 was normal.  MRI of brain on 01/19/2021 was within normal limits (noted nonspecific single punctate T2/FLAIR hyperintensity in right parietal white matter of likely no clinical significance).  Denies history of headaches, seizures, or head trauma. Regarding family history:  Brother had seizures as a toddler.  He has an uncle with seizure disorder.  His half-sister has multiple sclerosis.  He was told by his mother that there is a family history of some kind of brainstem disorder.  PAST MEDICAL HISTORY: Past Medical History:  Diagnosis Date   Depression    after a breakup with a GF    Episodic ataxia (HCC) 2022   Neuro, Dr. Gennett    Nephrolithiasis 2022   Left    MEDICATIONS: Current Outpatient Medications on File Prior to Visit  Medication Sig Dispense Refill   acetaZOLAMIDE  ER (DIAMOX ) 500 MG capsule Take 1 capsule (500 mg total) by mouth daily. 90 capsule 3   No current facility-administered medications on file prior to visit.    ALLERGIES: No Known Allergies  FAMILY HISTORY: Family History  Problem Relation Age of Onset   Seizures Brother       Objective:  Blood pressure 123/80, pulse 87, height 5' 10 (1.778 m), weight 195 lb (88.5 kg), SpO2 98%. General: No acute distress.  Patient appears well-groomed.   Head:  Normocephalic/atraumatic Eyes:  Fundi examined but not visualized Neck: supple, no paraspinal tenderness, full range of motion Heart:  Regular rate and rhythm Back: No paraspinal tenderness Neurological Exam: alert and oriented to person, place, and time.  Speech fluent and not dysarthric, language intact.  CN II-XII intact. Bulk and tone normal, muscle strength 5/5 throughout.  Sensation to temperature and vibration intact.  Deep tendon reflexes 2+ throughout, toes downgoing.  Finger to nose testing intact.  Gait normal, able to turn and tandem walk.  Romberg negative.   Juliene Dunnings, DO  CC: Aleene Hockey, MD

## 2024-09-17 ENCOUNTER — Encounter: Payer: Self-pay | Admitting: Neurology

## 2024-09-17 ENCOUNTER — Ambulatory Visit: Admitting: Neurology

## 2024-09-17 ENCOUNTER — Other Ambulatory Visit

## 2024-09-17 VITALS — BP 123/80 | HR 87 | Ht 70.0 in | Wt 195.0 lb

## 2024-09-17 DIAGNOSIS — G118 Other hereditary ataxias: Secondary | ICD-10-CM | POA: Diagnosis not present

## 2024-09-17 DIAGNOSIS — Z79899 Other long term (current) drug therapy: Secondary | ICD-10-CM

## 2024-09-17 MED ORDER — ACETAZOLAMIDE ER 500 MG PO CP12: 500.0000 mg | ORAL_CAPSULE | Freq: Every day | ORAL | 3 refills | Status: AC

## 2024-09-17 NOTE — Patient Instructions (Signed)
 Refilled acetazolamide  500mg  twice daily Check BMP today Follow up one year

## 2024-09-18 LAB — BASIC METABOLIC PANEL WITH GFR
BUN: 18 mg/dL (ref 7–25)
CO2: 24 mmol/L (ref 20–32)
Calcium: 9.2 mg/dL (ref 8.6–10.3)
Chloride: 109 mmol/L (ref 98–110)
Creat: 1.01 mg/dL (ref 0.60–1.26)
Glucose, Bld: 91 mg/dL (ref 65–99)
Potassium: 3.9 mmol/L (ref 3.5–5.3)
Sodium: 140 mmol/L (ref 135–146)
eGFR: 101 mL/min/1.73m2 (ref >=60)

## 2024-11-16 ENCOUNTER — Encounter: Payer: BC Managed Care – PPO | Admitting: Family Medicine

## 2024-11-16 NOTE — Progress Notes (Unsigned)
 "     Office Note 11/16/2024  CC: No chief complaint on file.   HPI:  Patient is a 34 y.o. male who is here for annual health maintenance exam.  Past Medical History:  Diagnosis Date   Depression    after a breakup with a GF   Episodic ataxia (HCC) 2022   Neuro, Dr. Evander    Nephrolithiasis 2022   Left    No past surgical history on file.  Family History  Problem Relation Age of Onset   Seizures Brother     Social History   Socioeconomic History   Marital status: Married    Spouse name: Not on file   Number of children: Not on file   Years of education: Not on file   Highest education level: Not on file  Occupational History   Not on file  Tobacco Use   Smoking status: Former    Types: Cigarettes   Smokeless tobacco: Never  Vaping Use   Vaping status: Never Used  Substance and Sexual Activity   Alcohol use: Yes    Comment: social   Drug use: Never   Sexual activity: Not on file  Other Topics Concern   Not on file  Social History Narrative   Married, no children.   Orig from [place] area.   Educ: [employer].  Some college ([employer]).   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   Alc: once in a while.   Tob: no   Drugs: none   Social Drivers of Health   Tobacco Use: Medium Risk (09/17/2024)   Patient History    Smoking Tobacco Use: Former    Smokeless Tobacco Use: Never    Passive Exposure: Not on file  Financial Resource Strain: Not on file  Food Insecurity: Not on file  Transportation Needs: Not on file  Physical Activity: Not on file  Stress: Not on file (08/25/2023)  Social Connections: Not on file  Intimate Partner Violence: Not on file  Depression (PHQ2-9): Medium Risk (11/16/2023)   Depression (PHQ2-9)    PHQ-2 Score: 5  Alcohol Screen: Not on file  Housing: Not on file  Utilities: Not on file  Health Literacy: Not on file    Outpatient Medications Prior to Visit  Medication Sig Dispense Refill    acetaZOLAMIDE  ER (DIAMOX ) 500 MG capsule Take 1 capsule (500 mg total) by mouth daily. 90 capsule 3   No facility-administered medications prior to visit.    Allergies[1]  Review of Systems *** PE;    09/17/2024    3:41 PM 11/16/2023    8:47 AM 02/15/2023   10:57 AM  Vitals with BMI  Height 5' 10 5' 11.5 5' 10  Weight 195 lbs 211 lbs 3 oz 204 lbs  BMI 27.98 29.05 29.27  Systolic 123 115 881  Diastolic 80 78 75  Pulse 87 84 88     *** Pertinent labs:  Lab Results  Component Value Date   TSH 0.84 10/22/2020   Lab Results  Component Value Date   WBC 4.9 10/22/2020   HGB 14.0 10/22/2020   HCT 40.8 10/22/2020   MCV 83.4 10/22/2020   PLT 205.0 10/22/2020   Lab Results  Component Value Date   CREATININE 1.01 09/17/2024   BUN 18 09/17/2024   NA 140 09/17/2024   K 3.9 09/17/2024   CL 109 09/17/2024   CO2 24 09/17/2024   Lab Results  Component Value Date   ALT 14 10/22/2020   AST 15  10/22/2020   ALKPHOS 59 10/22/2020   BILITOT 0.9 10/22/2020   ASSESSMENT AND PLAN:   No problem-specific Assessment & Plan notes found for this encounter.  Health maintenance exam: Reviewed age and gender appropriate health maintenance issues (prudent diet, regular exercise, health risks of tobacco and excessive alcohol, use of seatbelts, fire alarms in home, use of sunscreen).  Also reviewed age and gender appropriate health screening as well as vaccine recommendations. Vaccines: Flu declined***.  O/W All UTD. Labs: he declined any screening lab work today***.  An After Visit Summary was printed and given to the patient.  FOLLOW UP:  No follow-ups on file.  Signed:  Gerlene Hockey, MD           11/16/2024     [1] No Known Allergies  "

## 2024-11-21 ENCOUNTER — Encounter: Payer: Self-pay | Admitting: Family Medicine

## 2025-09-17 ENCOUNTER — Ambulatory Visit: Admitting: Neurology
